# Patient Record
Sex: Male | Born: 1990 | Race: Black or African American | Hispanic: No | Marital: Single | Smoking: Never smoker
Health system: Southern US, Community
[De-identification: ages and names within clinical notes are randomized; demographics above are authoritative.]

## PROBLEM LIST (undated history)

## (undated) DIAGNOSIS — E119 Type 2 diabetes mellitus without complications: Secondary | ICD-10-CM

## (undated) DIAGNOSIS — E669 Obesity, unspecified: Secondary | ICD-10-CM

## (undated) DIAGNOSIS — I10 Essential (primary) hypertension: Secondary | ICD-10-CM

---

## 2016-08-03 ENCOUNTER — Emergency Department (HOSPITAL_COMMUNITY)
Admission: EM | Admit: 2016-08-03 | Discharge: 2016-08-03 | Disposition: A | Discharge status: Home / Self Care | Payer: Self-pay | Attending: Emergency Medicine | Admitting: Emergency Medicine

## 2016-08-03 ENCOUNTER — Encounter (HOSPITAL_COMMUNITY): Payer: Self-pay | Admitting: Emergency Medicine

## 2016-08-03 ENCOUNTER — Emergency Department (HOSPITAL_COMMUNITY): Payer: Self-pay

## 2016-08-03 DIAGNOSIS — M5416 Radiculopathy, lumbar region: Secondary | ICD-10-CM | POA: Insufficient documentation

## 2016-08-03 DIAGNOSIS — Z79899 Other long term (current) drug therapy: Secondary | ICD-10-CM | POA: Insufficient documentation

## 2016-08-03 DIAGNOSIS — I10 Essential (primary) hypertension: Secondary | ICD-10-CM | POA: Insufficient documentation

## 2016-08-03 HISTORY — DX: Obesity, unspecified: E66.9

## 2016-08-03 MED ORDER — HYDROCHLOROTHIAZIDE 25 MG PO TABS: 25.0000 mg | ORAL_TABLET | Freq: Every day | ORAL | 0 refills | Status: AC

## 2016-08-03 MED ORDER — CYCLOBENZAPRINE HCL 10 MG PO TABS: 10.0000 mg | ORAL_TABLET | Freq: Two times a day (BID) | ORAL | 0 refills | Status: DC | PRN

## 2016-08-03 MED ORDER — NAPROXEN 500 MG PO TABS: 500.0000 mg | ORAL_TABLET | Freq: Two times a day (BID) | ORAL | 0 refills | Status: AC

## 2016-08-03 NOTE — ED Provider Notes (Signed)
[facility]-EMERGENCY DEPT Provider Note   CSN: [id number] Arrival date & time: 08/03/16  4098     History   Chief Complaint Chief Complaint  Patient presents with  . Groin Pain  . Calf Pain    HPI Rahman Ferrall is a 26 y.o. male.  HPI Patient presents to emergency room with complaints of right hip pain and leg pain. The patient states symptoms started a couple days ago. He noticed pain in his right hip region seemed to go down towards his knee and his calf. Aching type pain. It increases with certain movements of his hip as well as walking. He has tried some over-the-counter medications without relief. Last night the patient noticed a sensation in his right calf for felt like it fell asleep.  The symptoms have persisted. He had a similar episode several years ago. He was scheduled to have an MRI however he could not tolerate a closed MRI and had to reschedule for an open MRI. That never was arranged and his symptoms eventually resolved on its own. He denies any weakness. He denies any incontinence.  Past Medical History:  Diagnosis Date  . Obesity     There are no active problems to display for this patient.   History reviewed. No pertinent surgical history.     Home Medications    Prior to Admission medications   Medication Sig Start Date End Date Taking? Authorizing Provider  cyclobenzaprine (FLEXERIL) 10 MG tablet Take 1 tablet (10 mg total) by mouth 2 (two) times daily as needed for muscle spasms. 08/03/16   Linwood Dibbles, MD  hydrochlorothiazide (HYDRODIURIL) 25 MG tablet Take 1 tablet (25 mg total) by mouth daily. 08/03/16   Linwood Dibbles, MD  naproxen (NAPROSYN) 500 MG tablet Take 1 tablet (500 mg total) by mouth 2 (two) times daily. 08/03/16   Linwood Dibbles, MD    Family History No family history on file.  Social History Social History  Substance Use Topics  . Smoking status: Never Smoker  . Smokeless tobacco: Never Used  . Alcohol use No     Allergies   Patient has no  known allergies.   Review of Systems Review of Systems  All other systems reviewed and are negative.    Physical Exam Updated Vital Signs BP (!) 166/65 (BP Location: Right Arm)   Pulse 80   Temp 99.1 F (37.3 C) (Oral)   Resp 16   Ht 5\' 11"  (1.803 m)   Wt (!) 207.7 kg   SpO2 98%   BMI 63.88 kg/m   Physical Exam  Constitutional: No distress.  Morbidly obese  HENT:  Head: Normocephalic and atraumatic.  Right Ear: External ear normal.  Left Ear: External ear normal.  Eyes: Conjunctivae are normal. Right eye exhibits no discharge. Left eye exhibits no discharge. No scleral icterus.  Neck: Neck supple. No tracheal deviation present.  Cardiovascular: Normal rate.   Pulmonary/Chest: Effort normal. No stridor. No respiratory distress.  Abdominal: He exhibits no distension.  Musculoskeletal: He exhibits no edema.  Mild tenderness with range of motion of his right hip,  Neurological: He is alert. Cranial nerve deficit: no gross deficits.  5 out of 5 strength plantar flexion dorsiflexion, sensation intact bilateral lower extremities but subjectively altered right compared to left  Skin: Skin is warm and dry. No rash noted.  Psychiatric: He has a normal mood and affect.  Nursing note and vitals reviewed.    ED Treatments / Results    Radiology Dg Hip Unilat  With Pelvis 2-3 Views Right  Result Date: 08/03/2016 CLINICAL DATA:  Right hip and groin pain for 2-3 days. No known injury. EXAM: DG HIP (WITH OR WITHOUT PELVIS) 2-3V RIGHT COMPARISON:  None. FINDINGS: There is no evidence of hip fracture or dislocation. There is no evidence of arthropathy or other focal bone abnormality. IMPRESSION: Negative exam. Electronically Signed   By: Drusilla Kannerhomas  Dalessio M.D.   On: 08/03/2016 07:52    Procedures Procedures (including critical care time)  Medications Ordered in ED Medications - No data to display   Initial Impression / Assessment and Plan / ED Course  I have reviewed the  triage vital signs and the nursing notes.  Pertinent labs & imaging results that were available during my care of the patient were reviewed by me and considered in my medical decision making (see chart for details).   patient's back pain is suggestive of a lumbar radiculopathy. Patient's symptoms are overall mild. He does not have any acute neurologic deficits. Discussed outpatient treatment with outpatient follow-up.  Patient was also noted to be hypertensive. He is also extremely obese.  I stressed the importance of following up with a primary care doctor to have his blood pressure evaluated. I suggested exercise, weight loss. I will start him on hydrochlorothiazide in the meantime.  Final Clinical Impressions(s) / ED Diagnoses   Final diagnoses:  Lumbar radiculopathy  Essential hypertension    New Prescriptions New Prescriptions   CYCLOBENZAPRINE (FLEXERIL) 10 MG TABLET    Take 1 tablet (10 mg total) by mouth 2 (two) times daily as needed for muscle spasms.   HYDROCHLOROTHIAZIDE (HYDRODIURIL) 25 MG TABLET    Take 1 tablet (25 mg total) by mouth daily.   NAPROXEN (NAPROSYN) 500 MG TABLET    Take 1 tablet (500 mg total) by mouth 2 (two) times daily.     Linwood DibblesKnapp, Elenna Spratling, MD 08/03/16 [phone]

## 2016-08-03 NOTE — ED Triage Notes (Addendum)
Pt. Reports right groin pain and right calf pain onset last week unrelieved by OTC pain medication , ambulatory / denies injury . Pain increases with movement and walking . He is hypertensive at arrival .

## 2016-08-03 NOTE — Discharge Instructions (Signed)
Take the medications as needed for pain, the hydrochlorothiazide is a medication for her blood pressure, please make sure to follow up with a primary care doctor for further evaluation as we discussed

## 2016-08-03 NOTE — ED Notes (Signed)
Patient c/o right hip pain onset several days ago, states he has had the same pain before and was told he had a pinched nerve, states he also has pain from knee sown. Ambulatory to ed.

## 2019-03-14 ENCOUNTER — Other Ambulatory Visit: Payer: Self-pay

## 2019-03-14 ENCOUNTER — Emergency Department (HOSPITAL_COMMUNITY): Payer: Self-pay

## 2019-03-14 ENCOUNTER — Emergency Department (HOSPITAL_COMMUNITY)
Admission: EM | Admit: 2019-03-14 | Discharge: 2019-03-14 | Disposition: A | Discharge status: Home / Self Care | Payer: Self-pay | Attending: Emergency Medicine | Admitting: Emergency Medicine

## 2019-03-14 ENCOUNTER — Encounter (HOSPITAL_COMMUNITY): Payer: Self-pay

## 2019-03-14 DIAGNOSIS — Y9389 Activity, other specified: Secondary | ICD-10-CM | POA: Insufficient documentation

## 2019-03-14 DIAGNOSIS — Y999 Unspecified external cause status: Secondary | ICD-10-CM | POA: Insufficient documentation

## 2019-03-14 DIAGNOSIS — S39012A Strain of muscle, fascia and tendon of lower back, initial encounter: Secondary | ICD-10-CM | POA: Insufficient documentation

## 2019-03-14 DIAGNOSIS — Y929 Unspecified place or not applicable: Secondary | ICD-10-CM | POA: Insufficient documentation

## 2019-03-14 DIAGNOSIS — X500XXA Overexertion from strenuous movement or load, initial encounter: Secondary | ICD-10-CM | POA: Insufficient documentation

## 2019-03-14 DIAGNOSIS — I10 Essential (primary) hypertension: Secondary | ICD-10-CM

## 2019-03-14 LAB — I-STAT CHEM 8, ED
BUN: 17 mg/dL (ref 6–20)
Calcium, Ion: 1.25 mmol/L (ref 1.15–1.40)
Chloride: 103 mmol/L (ref 98–111)
Creatinine, Ser: 0.9 mg/dL (ref 0.61–1.24)
Glucose, Bld: 181 mg/dL — ABNORMAL HIGH (ref 70–99)
HCT: 49 % (ref 39.0–52.0)
Hemoglobin: 16.7 g/dL (ref 13.0–17.0)
Potassium: 3.9 mmol/L (ref 3.5–5.1)
Sodium: 140 mmol/L (ref 135–145)
TCO2: 30 mmol/L (ref 22–32)

## 2019-03-14 LAB — CBC WITH DIFFERENTIAL/PLATELET
Abs Immature Granulocytes: 0.04 10*3/uL (ref 0.00–0.07)
Basophils Absolute: 0 10*3/uL (ref 0.0–0.1)
Basophils Relative: 0 %
Eosinophils Absolute: 0.1 10*3/uL (ref 0.0–0.5)
Eosinophils Relative: 1 %
HCT: 48.5 % (ref 39.0–52.0)
Hemoglobin: 16 g/dL (ref 13.0–17.0)
Immature Granulocytes: 0 %
Lymphocytes Relative: 19 %
Lymphs Abs: 2 10*3/uL (ref 0.7–4.0)
MCH: 28.5 pg (ref 26.0–34.0)
MCHC: 33 g/dL (ref 30.0–36.0)
MCV: 86.3 fL (ref 80.0–100.0)
Monocytes Absolute: 1 10*3/uL (ref 0.1–1.0)
Monocytes Relative: 9 %
Neutro Abs: 7.8 10*3/uL — ABNORMAL HIGH (ref 1.7–7.7)
Neutrophils Relative %: 71 %
Platelets: 362 10*3/uL (ref 150–400)
RBC: 5.62 MIL/uL (ref 4.22–5.81)
RDW: 13.3 % (ref 11.5–15.5)
WBC: 10.9 10*3/uL — ABNORMAL HIGH (ref 4.0–10.5)
nRBC: 0 % (ref 0.0–0.2)

## 2019-03-14 MED ORDER — IBUPROFEN 600 MG PO TABS: 600.0000 mg | ORAL_TABLET | Freq: Four times a day (QID) | ORAL | 0 refills | Status: AC | PRN

## 2019-03-14 MED ORDER — CYCLOBENZAPRINE HCL 10 MG PO TABS: 10.0000 mg | ORAL_TABLET | Freq: Two times a day (BID) | ORAL | 0 refills | Status: AC | PRN

## 2019-03-14 MED ORDER — METHOCARBAMOL 1000 MG/10ML IJ SOLN: 1000.0000 mg | Freq: Once | INTRAMUSCULAR | Status: DC

## 2019-03-14 MED ORDER — METHOCARBAMOL 1000 MG/10ML IJ SOLN
1000.0000 mg | Freq: Once | INTRAVENOUS | Status: AC
  Administered 2019-03-14: 1000 mg via INTRAVENOUS
  Filled 2019-03-14: qty 10

## 2019-03-14 MED ORDER — MORPHINE SULFATE (PF) 4 MG/ML IV SOLN
6.0000 mg | Freq: Once | INTRAVENOUS | Status: AC
  Administered 2019-03-14: 6 mg via INTRAVENOUS
  Filled 2019-03-14: qty 2

## 2019-03-14 NOTE — ED Notes (Signed)
Pt verbalized understanding of d/c instructions, follow up care, scripts and s/s requiring return to ED. Pt had no further questions at this time 

## 2019-03-14 NOTE — Discharge Instructions (Signed)
Take medications prescribed for your back pain.  Follow up with your doctor for a recheck of your blood pressure as it is high today.

## 2019-03-14 NOTE — ED Notes (Signed)
Patient transported to X-ray 

## 2019-03-14 NOTE — ED Provider Notes (Signed)
[name] Provider Note   CSN: [id number] Arrival date & time: 03/14/19  1638     History Chief Complaint  Patient presents with  . Back Pain    Manuel Ross is a 28 y.o. male.  The history is provided by the patient. No language interpreter was used.  Back Pain Associated symptoms: no abdominal pain, no chest pain, no dysuria and no numbness        28 year old morbidly obese male presenting complaining of back pain.  Patient endorsed persistent left low back pain ongoing for nearly a week.  Pain initially started after he was helping his brother with moving from furniture.  He endorsed gradual onset of progressive worsening sharp shooting pain starting his left lower back radiates down to his left calf.  Pain more noticeable when he stands or when he is sit down.  He tries resting at home without adequate relief.  He does not complain of any lightheadedness, dizziness, chest pain, abdominal pain, dysuria, bowel bladder incontinence or saddle anesthesia.  No history of IV drug use active cancer.  Denies history of high blood pressure however he was prescribed blood pressure medication in the past.  Report remote history of similar back pain which she thought is related to sciatica.  He denies any numbness down his leg.  Past Medical History:  Diagnosis Date  . Obesity     There are no problems to display for this patient.   History reviewed. No pertinent surgical history.     No family history on file.  Social History   Tobacco Use  . Smoking status: Never Smoker  . Smokeless tobacco: Never Used  Substance Use Topics  . Alcohol use: No  . Drug use: No    Home Medications Prior to Admission medications   Medication Sig Start Date End Date Taking? Authorizing Provider  cyclobenzaprine (FLEXERIL) 10 MG tablet Take 1 tablet (10 mg total) by mouth 2 (two) times daily as needed for muscle spasms. 08/03/16   Dorie Rank, MD   hydrochlorothiazide (HYDRODIURIL) 25 MG tablet Take 1 tablet (25 mg total) by mouth daily. 08/03/16   Dorie Rank, MD  naproxen (NAPROSYN) 500 MG tablet Take 1 tablet (500 mg total) by mouth 2 (two) times daily. 08/03/16   Dorie Rank, MD    Allergies    Patient has no known allergies.  Review of Systems   Review of Systems  Cardiovascular: Negative for chest pain.  Gastrointestinal: Negative for abdominal pain.  Genitourinary: Negative for dysuria.  Musculoskeletal: Positive for back pain.  Neurological: Negative for numbness.  All other systems reviewed and are negative.   Physical Exam Updated Vital Signs BP (!) 199/120 (BP Location: Left Arm)   Pulse 100   Temp 98.5 F (36.9 C) (Oral)   Resp 18   SpO2 98%   Physical Exam Vitals and nursing note reviewed.  Constitutional:      General: He is not in acute distress.    Appearance: He is well-developed. He is obese.     Comments: Morbidly obese male in no acute discomfort.  HENT:     Head: Atraumatic.  Eyes:     Conjunctiva/sclera: Conjunctivae normal.  Cardiovascular:     Rate and Rhythm: Tachycardia present.     Pulses: Normal pulses.     Heart sounds: Normal heart sounds.  Pulmonary:     Breath sounds: Normal breath sounds.  Abdominal:     Palpations: Abdomen is soft. There is  no mass.     Tenderness: There is no right CVA tenderness or left CVA tenderness.  Musculoskeletal:        General: Tenderness (Tenderness to lumbar left lumbar paraspinal muscle on palpation with normal hip flexion and negative straight leg raise.  No significant midline spine tenderness) present.     Cervical back: Neck supple.  Skin:    Capillary Refill: Capillary refill takes less than 2 seconds.     Findings: No rash.  Neurological:     Mental Status: He is alert and oriented to person, place, and time.     ED Results / Procedures / Treatments   Labs (all labs ordered are listed, but only abnormal results are displayed) Labs  Reviewed  CBC WITH DIFFERENTIAL/PLATELET - Abnormal; Notable for the following components:      Result Value   WBC 10.9 (*)    Neutro Abs 7.8 (*)    All other components within normal limits  I-STAT CHEM 8, ED - Abnormal; Notable for the following components:   Glucose, Bld 181 (*)    All other components within normal limits  URINALYSIS, ROUTINE W REFLEX MICROSCOPIC    EKG None  Radiology Lumbar Spine  Result Date: 03/14/2019 CLINICAL DATA:  Back pain EXAM: LUMBAR SPINE - COMPLETE 4+ VIEW COMPARISON:  None. FINDINGS: There is no evidence of lumbar spine fracture. Alignment is normal. There is mild disc height loss seen at L4-L5 and L5-S1 with small anterior osteophytes. IMPRESSION: No acute fracture or malalignment. Electronically Signed   By: Jonna ClarkBindu  Avutu M.D.   On: 03/14/2019 19:23    Procedures Procedures (including critical care time)  Medications Ordered in ED Medications - No data to display  ED Course  I have reviewed the triage vital signs and the nursing notes.  Pertinent labs & imaging results that were available during my care of the patient were reviewed by me and considered in my medical decision making (see chart for details).    MDM Rules/Calculators/A&P                      BP (!) 168/119 (BP Location: Right Arm)   Pulse (!) 109   Temp 98.5 F (36.9 C) (Oral)   Resp 19   SpO2 97%   Final Clinical Impression(s) / ED Diagnoses Final diagnoses:  Strain of lumbar region, initial encounter  Hypertension, unspecified type    Rx / DC Orders ED Discharge Orders         Ordered    ibuprofen (ADVIL) 600 MG tablet  Every 6 hours PRN     03/14/19 2034    cyclobenzaprine (FLEXERIL) 10 MG tablet  2 times daily PRN     03/14/19 2034         5:04 PM Patient here with radicular left low back pain likely musculoskeletal pain.  Low suspicion for dissection causing pain or kidney stone.  However patient found to be hypertensive with initial blood pressure of  199/120.  This could be pain related.  However, will monitor closely and will treat symptoms.  Plan to check basic labs including kidney function.  He does not have any significant midline spine tenderness to suggest fracture or dislocation.  He is able to ambulate.  He has intact distal pulses.  Care discussed with DR. Allen  8:35 PM Patient report feeling much better.  Ambulate without difficulty.  Blood pressure did improve.  X-ray of lumbar spine unremarkable, labs are reassuring, normal  kidney function.  Patient discharged home with symptomatic treatment.  Encourage patient to follow-up with primary care provider for recheck of his blood pressure as it is elevated today likely secondary to pain.   Fayrene Helper, PA-C 03/14/19 2036    Lorre Nick, MD 03/15/19 [phone]

## 2019-03-14 NOTE — ED Triage Notes (Signed)
Pt reports 1 week of lower back pain that radiates down his right thigh. Denies fall or trauma. Pt states he has hx of sciatica nerve pain. Pt a.o, nad noted. Pt hypertensive in triage.

## 2019-03-14 NOTE — ED Notes (Signed)
Pt made aware for need of urine sample. 

## 2019-04-01 ENCOUNTER — Other Ambulatory Visit: Payer: Self-pay | Admitting: Cardiology

## 2019-04-01 DIAGNOSIS — Z20822 Contact with and (suspected) exposure to covid-19: Secondary | ICD-10-CM

## 2019-04-03 LAB — NOVEL CORONAVIRUS, NAA: SARS-CoV-2, NAA: NOT DETECTED

## 2020-11-17 IMAGING — DX DG LUMBAR SPINE COMPLETE 4+V
5 series · 5 of 5 positions shown · non-contrast
Comparison: None.

CLINICAL DATA: Back pain

EXAM:
LUMBAR SPINE - COMPLETE 4+ VIEW

[l-spine ap]
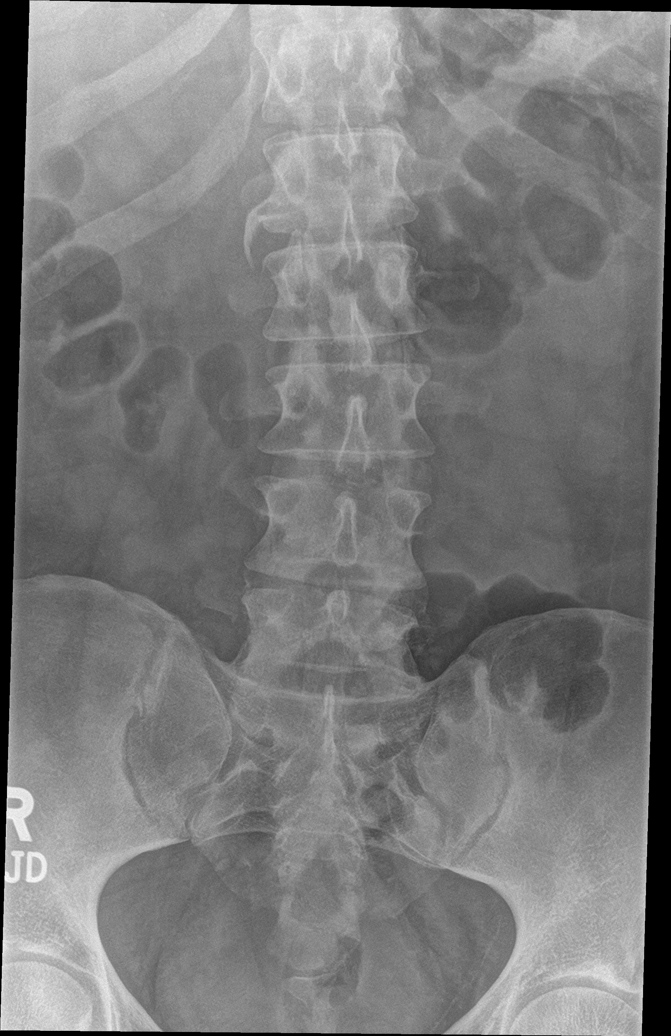

[l-spine obl (1 of 2)]
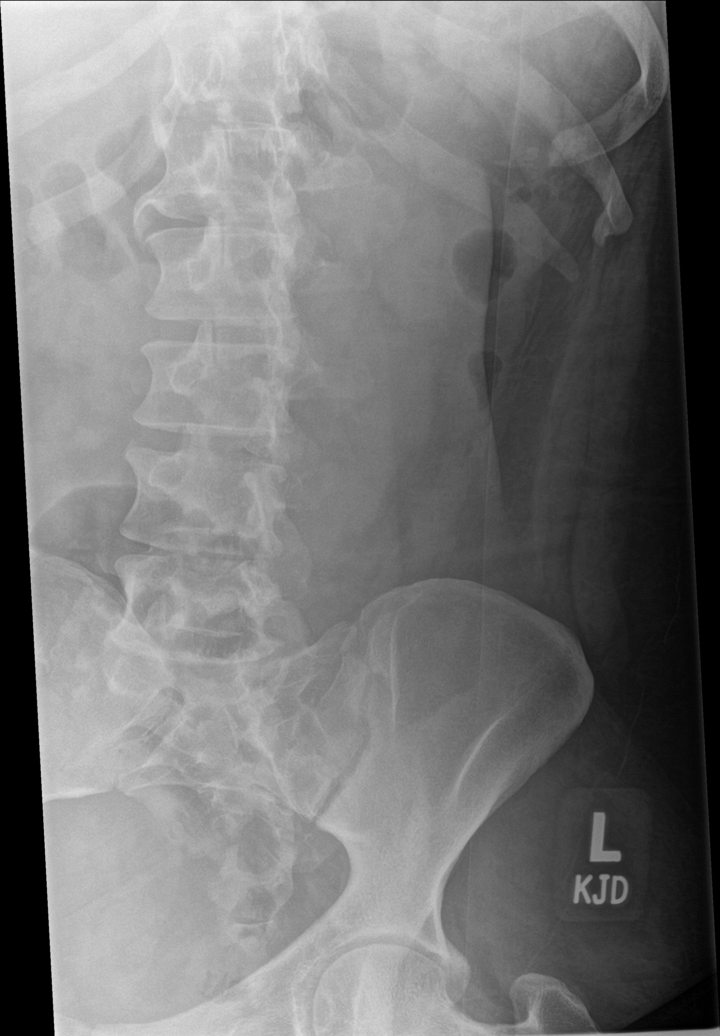

[l-spine obl (2 of 2)]
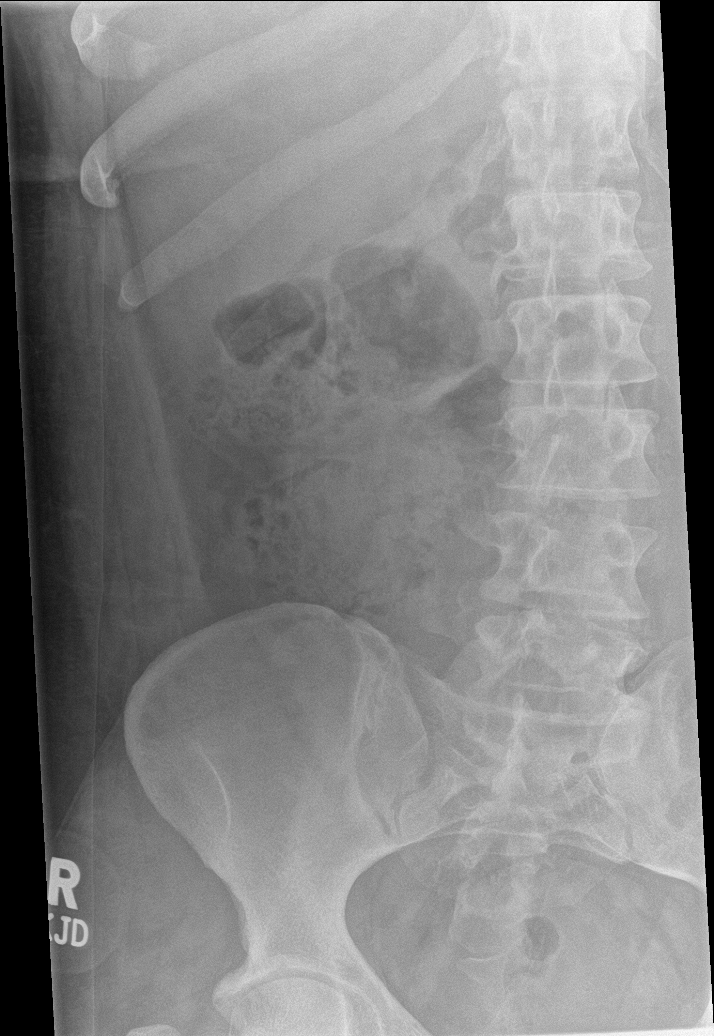

[l-spine lat]
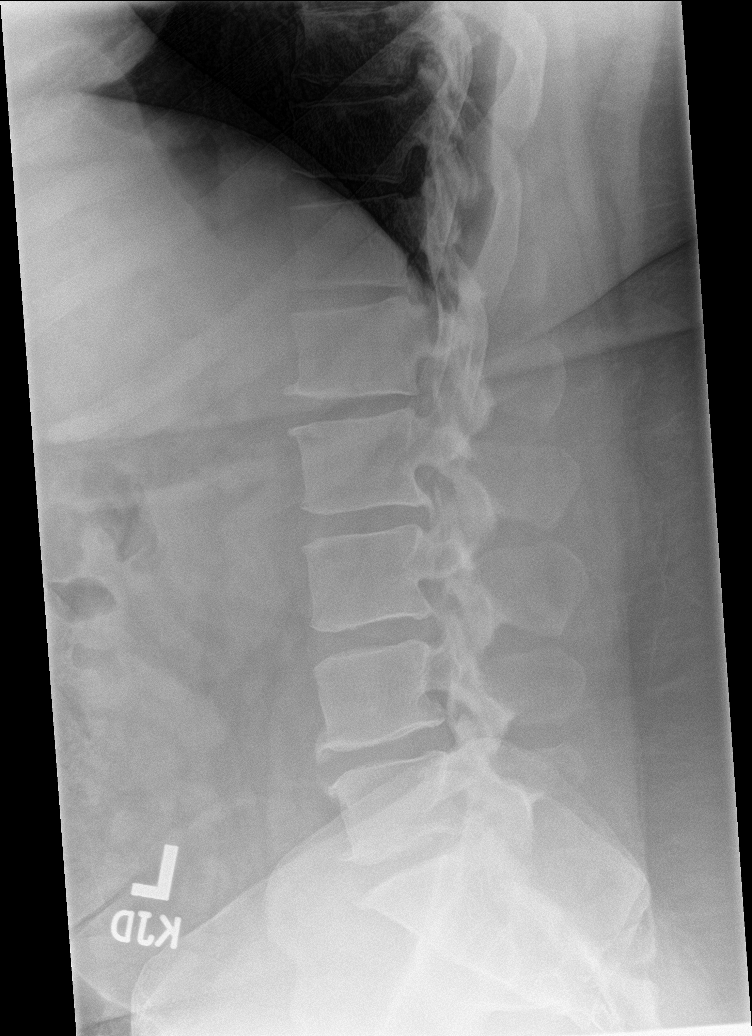

[l-spine spot]
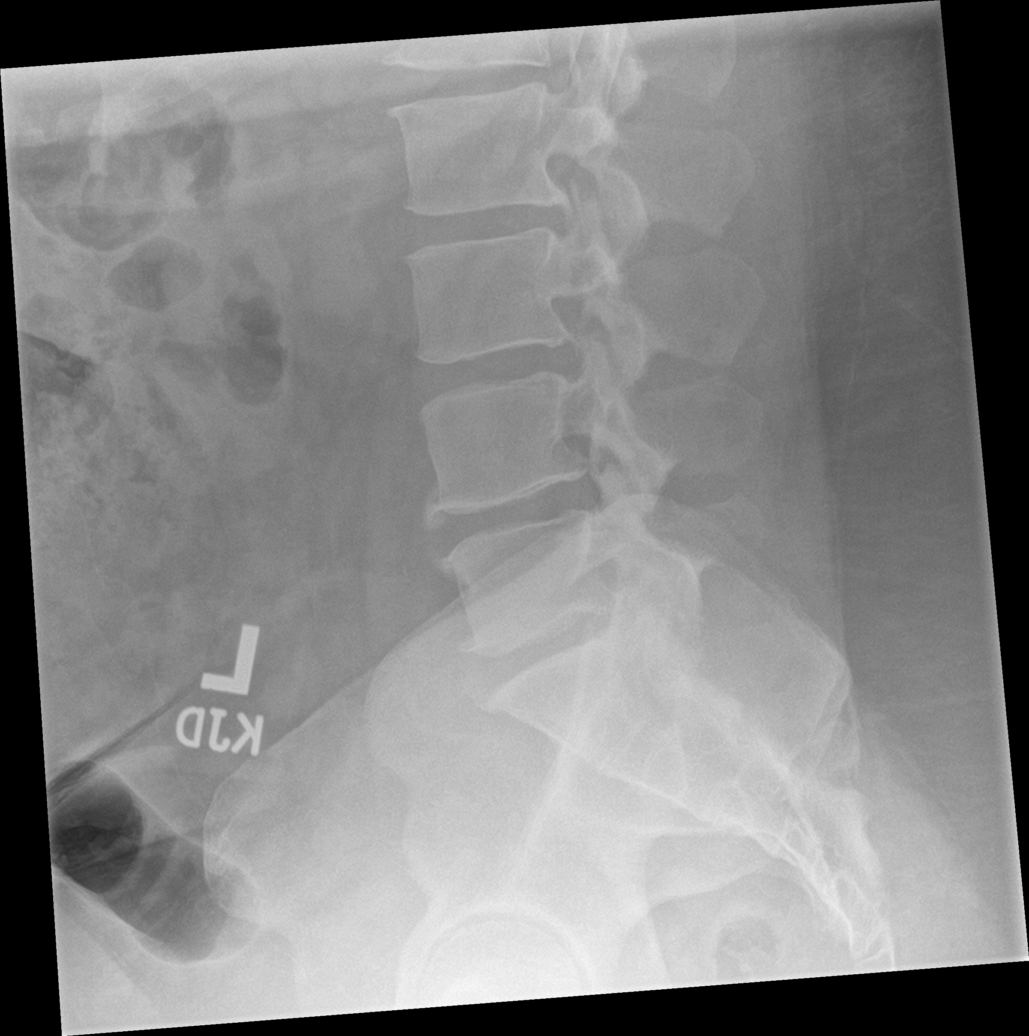

[5 of 5 positions shown; findings below may reference images not displayed]

FINDINGS: There is no evidence of lumbar spine fracture. Alignment is normal.
There is mild disc height loss seen at L4-L5 and L5-S1 with small
anterior osteophytes.
IMPRESSION: No acute fracture or malalignment.

## 2022-05-06 ENCOUNTER — Emergency Department (HOSPITAL_COMMUNITY)
Admission: EM | Admit: 2022-05-06 | Discharge: 2022-05-07 | Disposition: A | Discharge status: Home / Self Care | Payer: Managed Care, Other (non HMO) | Attending: Emergency Medicine | Admitting: Emergency Medicine

## 2022-05-06 ENCOUNTER — Encounter (HOSPITAL_COMMUNITY): Payer: Self-pay

## 2022-05-06 ENCOUNTER — Emergency Department (HOSPITAL_COMMUNITY): Payer: Managed Care, Other (non HMO)

## 2022-05-06 ENCOUNTER — Other Ambulatory Visit: Payer: Self-pay

## 2022-05-06 DIAGNOSIS — R109 Unspecified abdominal pain: Secondary | ICD-10-CM | POA: Diagnosis present

## 2022-05-06 DIAGNOSIS — K429 Umbilical hernia without obstruction or gangrene: Secondary | ICD-10-CM | POA: Diagnosis not present

## 2022-05-06 HISTORY — DX: Type 2 diabetes mellitus without complications: E11.9

## 2022-05-06 HISTORY — DX: Essential (primary) hypertension: I10

## 2022-05-06 LAB — COMPREHENSIVE METABOLIC PANEL
ALT: 33 U/L (ref 0–44)
AST: 24 U/L (ref 15–41)
Albumin: 3.7 g/dL (ref 3.5–5.0)
Alkaline Phosphatase: 80 U/L (ref 38–126)
Anion gap: 10 (ref 5–15)
BUN: 13 mg/dL (ref 6–20)
CO2: 22 mmol/L (ref 22–32)
Calcium: 9.1 mg/dL (ref 8.9–10.3)
Chloride: 104 mmol/L (ref 98–111)
Creatinine, Ser: 0.76 mg/dL (ref 0.61–1.24)
GFR, Estimated: >60 mL/min (ref >60)
Glucose, Bld: 132 mg/dL — ABNORMAL HIGH (ref 70–99)
Potassium: 3.7 mmol/L (ref 3.5–5.1)
Sodium: 136 mmol/L (ref 135–145)
Total Bilirubin: 0.9 mg/dL (ref 0.3–1.2)
Total Protein: 8 g/dL (ref 6.5–8.1)

## 2022-05-06 LAB — CBC WITH DIFFERENTIAL/PLATELET
Abs Immature Granulocytes: 0.05 10*3/uL (ref 0.00–0.07)
Basophils Absolute: 0 10*3/uL (ref 0.0–0.1)
Basophils Relative: 0 %
Eosinophils Absolute: 0.1 10*3/uL (ref 0.0–0.5)
Eosinophils Relative: 1 %
HCT: 45.3 % (ref 39.0–52.0)
Hemoglobin: 14.6 g/dL (ref 13.0–17.0)
Immature Granulocytes: 1 %
Lymphocytes Relative: 36 %
Lymphs Abs: 3.9 10*3/uL (ref 0.7–4.0)
MCH: 28.1 pg (ref 26.0–34.0)
MCHC: 32.2 g/dL (ref 30.0–36.0)
MCV: 87.1 fL (ref 80.0–100.0)
Monocytes Absolute: 1.1 10*3/uL — ABNORMAL HIGH (ref 0.1–1.0)
Monocytes Relative: 10 %
Neutro Abs: 5.7 10*3/uL (ref 1.7–7.7)
Neutrophils Relative %: 52 %
Platelets: 361 10*3/uL (ref 150–400)
RBC: 5.2 MIL/uL (ref 4.22–5.81)
RDW: 14.4 % (ref 11.5–15.5)
WBC: 10.9 10*3/uL — ABNORMAL HIGH (ref 4.0–10.5)
nRBC: 0 % (ref 0.0–0.2)

## 2022-05-06 LAB — LIPASE, BLOOD: Lipase: 34 U/L (ref 11–51)

## 2022-05-06 MED ORDER — IOHEXOL 300 MG/ML  SOLN
100.0000 mL | Freq: Once | INTRAMUSCULAR | Status: AC | PRN
  Administered 2022-05-06: 100 mL via INTRAVENOUS

## 2022-05-06 NOTE — ED Triage Notes (Signed)
STATES THAT HE USED HIS FOOT TO OPEN A DOOR, WHEN HE HAD A SHARP PAIN FROM HIS GROIN TO HIS UMBILICAL

## 2022-05-06 NOTE — ED Provider Triage Note (Signed)
Emergency Medicine Provider Triage Evaluation Note  Manuel Ross , a 32 y.o. male  was evaluated in triage.  Pt complains of abdominal pain from a few days ago. The patient reports that he was opening a door with his foot and felt a pull go from his testicles to his umbilicus. He does not have any testicular pain ro swelling. He has been complaining of peri-umbilical pain. No other symptoms. He is concerned he has a hernia.  Review of Systems  Positive:  Negative:   Physical Exam  BP (!) 150/116 (BP Location: Right Arm)   Pulse (!) 116   Temp 98.2 F (36.8 C) (Oral)   Resp 18   Ht 5' 11"$  (1.803 m)   Wt (!) 183.3 kg   SpO2 97%   BMI 56.35 kg/m  Gen:   Awake, no distress   Resp:  Normal effort  MSK:   Moves extremities without difficulty  Other:  Abdominal exam limited to body habitus. No hernia palpated in a sitting position, however exam limited 2/2 triage srtting. Soft.   Medical Decision Making  Medically screening exam initiated at 9:33 PM.  Appropriate orders placed.  Manuel Ross was informed that the remainder of the evaluation will be completed by another provider, this initial triage assessment does not replace that evaluation, and the importance of remaining in the ED until their evaluation is complete.  CT with labs ordered   Manuel Ross, Manuel Ross 05/06/22 2140

## 2022-05-06 NOTE — ED Provider Notes (Signed)
[place] EMERGENCY DEPARTMENT AT [facility] Provider Note   CSN: [id number] Arrival date & time: 05/06/22  2038     History  Chief Complaint  Patient presents with   Abdominal Pain    Manuel Ross is a 32 y.o. male.  The history is provided by the patient and medical records.  Abdominal Pain  31 y.o. M presenting to the ED with abdominal pain.  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  He reports ongoing issues since then.  Denies any vomiting or diarrhea.  Pain worse with bending over for heavy lifting.  No fever/chills.  No prior abdominal surgeries.  He expresses concern for possible hernia.  Home Medications Prior to Admission medications   Medication Sig Start Date End Date Taking? Authorizing Provider  cyclobenzaprine (FLEXERIL) 10 MG tablet Take 1 tablet (10 mg total) by mouth 2 (two) times daily as needed for muscle spasms. 03/14/19   Fayrene Helper, PA-C  hydrochlorothiazide (HYDRODIURIL) 25 MG tablet Take 1 tablet (25 mg total) by mouth daily. 08/03/16   Linwood Dibbles, MD  ibuprofen (ADVIL) 600 MG tablet Take 1 tablet (600 mg total) by mouth every 6 (six) hours as needed. 03/14/19   Fayrene Helper, PA-C  naproxen (NAPROSYN) 500 MG tablet Take 1 tablet (500 mg total) by mouth 2 (two) times daily. 08/03/16   Linwood Dibbles, MD      Allergies    Patient has no known allergies.    Review of Systems   Review of Systems  Gastrointestinal:  Positive for abdominal pain.  All other systems reviewed and are negative.   Physical Exam Updated Vital Signs BP (!) 128/95   Pulse (!) 101   Temp 98.2 F (36.8 C) (Oral)   Resp 17   Ht 5\' 11"  (1.803 m)   Wt (!) 183.3 kg   SpO2 97%   BMI 56.35 kg/m   Physical Exam Vitals and nursing note reviewed.  Constitutional:      Appearance: He is well-developed.  HENT:     Head: Normocephalic and  atraumatic.  Eyes:     Conjunctiva/sclera: Conjunctivae normal.     Pupils: Pupils are equal, round, and reactive to light.  Cardiovascular:     Rate and Rhythm: Normal rate and regular rhythm.     Heart sounds: Normal heart sounds.  Pulmonary:     Effort: Pulmonary effort is normal.     Breath sounds: Normal breath sounds.  Abdominal:     General: Bowel sounds are normal.     Palpations: Abdomen is soft.     Tenderness: There is abdominal tenderness.     Comments: Abdominal exam limited due to habitus, seems to have some tenderness around the navel, no palpable bulge appreciated  Musculoskeletal:        General: Normal range of motion.     Cervical back: Normal range of motion.  Skin:    General: Skin is warm and dry.  Neurological:     Mental Status: He is alert and oriented to person, place, and time.     ED Results / Procedures / Treatments   Labs (all labs ordered are listed, but only abnormal results are displayed) Labs Reviewed  COMPREHENSIVE METABOLIC PANEL - Abnormal; Notable for the following components:      Result Value   Glucose,  Bld 132 (*)    All other components within normal limits  CBC WITH DIFFERENTIAL/PLATELET - Abnormal; Notable for the following components:   WBC 10.9 (*)    Monocytes Absolute 1.1 (*)    All other components within normal limits  LIPASE, BLOOD  URINALYSIS, ROUTINE W REFLEX MICROSCOPIC    EKG None  Radiology CT ABDOMEN PELVIS W CONTRAST  Result Date: 05/07/2022 CLINICAL DATA:  Periumbilical pain, groin pain EXAM: CT ABDOMEN AND PELVIS WITH CONTRAST TECHNIQUE: Multidetector CT imaging of the abdomen and pelvis was performed using the standard protocol following bolus administration of intravenous contrast. RADIATION DOSE REDUCTION: This exam was performed according to the departmental dose-optimization program which includes automated exposure control, adjustment of the mA and/or kV according to patient size and/or use of iterative  reconstruction technique. CONTRAST:  OMNIPAQUE IOHEXOL 300 MG/ML  SOLN COMPARISON:  None Available. FINDINGS: Lower chest: No acute abnormality. Hepatobiliary: Moderate hepatic steatosis. No enhancing intrahepatic mass. No intra or extrahepatic biliary ductal dilation. Gallbladder unremarkable. Pancreas: Unremarkable Spleen: Unremarkable Adrenals/Urinary Tract: Adrenal glands are unremarkable. Kidneys are normal, without renal calculi, focal lesion, or hydronephrosis. Bladder is unremarkable. Stomach/Bowel: Stomach is within normal limits. Appendix appears normal. No evidence of bowel wall thickening, distention, or inflammatory changes. Vascular/Lymphatic: No significant vascular findings are present. No enlarged abdominal or pelvic lymph nodes. Reproductive: Prostate is unremarkable. Other: Small fat containing umbilical hernia without superimposed inflammatory change. No inguinal hernia identified. Musculoskeletal: No acute bone abnormality. No lytic or blastic bone lesion. Degenerative changes are seen within the lumbar spine. 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 IMPRESSION: 1. No acute intra-abdominal pathology identified. No definite radiographic explanation for the patient's reported symptoms. 2. Small fat containing umbilical hernia without superimposed inflammatory change. 3. Moderate hepatic steatosis. 4. 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 This would be better assessed with dedicated MRI examination, if indicated. Electronically Signed   By: Helyn Numbers M.D.   On: 05/07/2022 00:25    Procedures Procedures     Medications Ordered in ED Medications  iohexol (OMNIPAQUE) 300 MG/ML solution 100 mL (100 mLs Intravenous Contrast Given 05/06/22 2338)    ED Course/ Medical Decision Making/ A&P                             Medical Decision Making  32 year old male presenting to the ED with umbilical pain after holding a door open with his foot last week and feeling a "pulling" sensation.  Pain worse with bending over or heavy lifting.  Concerned he may have a hernia.  His labs today are overall reassuring.  CT ordered from triage and is currently pending.    CT with findings of umbilical hernia, fat containing only.  No obstruction or incarceration.  Also has some canal stenosis noted on lumbar spine, however patient without active back pain.  No numbness/weakness of the extremities.  No incontinence or red flag symptoms.  Referred to general surgery office if he would like to see about getting hernia fixed.  Can return here for new concerns.  Final Clinical Impression(s) / ED Diagnoses Final diagnoses:  Umbilical hernia without obstruction and without gangrene  Rx / DC Orders ED Discharge Orders     None         Garlon Hatchet, PA-C 05/07/22 0046    Pricilla Loveless, MD 05/07/22 2119

## 2022-05-07 NOTE — Discharge Instructions (Signed)
You were found to have umbilical hernia today on CT scan. If you would like to see about getting this fixed, can follow-up with the surgery clinic.  Call for appt. Return here for any new/acute changes-- fever, vomiting, etc.
